# Patient Record
Sex: Female | Born: 1975
Health system: Southern US, Community
[De-identification: ages and names within clinical notes are randomized; demographics above are authoritative.]

---

## 2017-09-12 ENCOUNTER — Other Ambulatory Visit: Payer: Self-pay | Admitting: Internal Medicine

## 2017-09-12 DIAGNOSIS — Z1231 Encounter for screening mammogram for malignant neoplasm of breast: Secondary | ICD-10-CM

## 2017-09-26 ENCOUNTER — Other Ambulatory Visit (HOSPITAL_COMMUNITY)
Admission: RE | Admit: 2017-09-26 | Discharge: 2017-09-26 | Disposition: A | Discharge status: Home / Self Care | Payer: 59 | Source: Ambulatory Visit | Attending: Internal Medicine | Admitting: Internal Medicine

## 2017-09-26 ENCOUNTER — Other Ambulatory Visit: Payer: Self-pay | Admitting: Internal Medicine

## 2017-09-26 DIAGNOSIS — Z124 Encounter for screening for malignant neoplasm of cervix: Secondary | ICD-10-CM | POA: Insufficient documentation

## 2017-10-01 LAB — CYTOLOGY - PAP
Adequacy: ABSENT
CHLAMYDIA, DNA PROBE: NEGATIVE
Diagnosis: NEGATIVE
HPV (WINDOPATH): NOT DETECTED
Neisseria Gonorrhea: NEGATIVE

## 2017-10-14 ENCOUNTER — Ambulatory Visit
Admission: RE | Admit: 2017-10-14 | Discharge: 2017-10-14 | Disposition: A | Discharge status: Home / Self Care | Payer: PRIVATE HEALTH INSURANCE | Source: Ambulatory Visit | Attending: Internal Medicine | Admitting: Internal Medicine

## 2017-10-14 DIAGNOSIS — Z1231 Encounter for screening mammogram for malignant neoplasm of breast: Secondary | ICD-10-CM

## 2017-10-15 ENCOUNTER — Other Ambulatory Visit: Payer: Self-pay | Admitting: Internal Medicine

## 2017-10-15 DIAGNOSIS — R928 Other abnormal and inconclusive findings on diagnostic imaging of breast: Secondary | ICD-10-CM

## 2017-10-16 ENCOUNTER — Other Ambulatory Visit: Payer: Self-pay | Admitting: Internal Medicine

## 2017-10-16 DIAGNOSIS — R928 Other abnormal and inconclusive findings on diagnostic imaging of breast: Secondary | ICD-10-CM

## 2017-10-22 ENCOUNTER — Ambulatory Visit
Admission: RE | Admit: 2017-10-22 | Discharge: 2017-10-22 | Disposition: A | Discharge status: Home / Self Care | Payer: PRIVATE HEALTH INSURANCE | Source: Ambulatory Visit | Attending: Internal Medicine | Admitting: Internal Medicine

## 2017-10-22 ENCOUNTER — Ambulatory Visit: Payer: PRIVATE HEALTH INSURANCE

## 2017-10-22 DIAGNOSIS — R928 Other abnormal and inconclusive findings on diagnostic imaging of breast: Secondary | ICD-10-CM

## 2018-07-14 ENCOUNTER — Other Ambulatory Visit: Payer: Self-pay | Admitting: Internal Medicine

## 2018-07-14 ENCOUNTER — Ambulatory Visit
Admission: RE | Admit: 2018-07-14 | Discharge: 2018-07-14 | Disposition: A | Discharge status: Home / Self Care | Payer: PRIVATE HEALTH INSURANCE | Source: Ambulatory Visit | Attending: Internal Medicine | Admitting: Internal Medicine

## 2018-07-14 DIAGNOSIS — R1084 Generalized abdominal pain: Secondary | ICD-10-CM

## 2018-09-02 ENCOUNTER — Other Ambulatory Visit: Payer: Self-pay | Admitting: Obstetrics and Gynecology

## 2019-09-22 IMAGING — CR DG ABDOMEN ACUTE W/ 1V CHEST
3 series · 3 of 3 positions shown · non-contrast
Comparison: None.

CLINICAL DATA: Abdominal pain for 1 week

EXAM:
DG ABDOMEN ACUTE W/ 1V CHEST

[t abdomen supine]
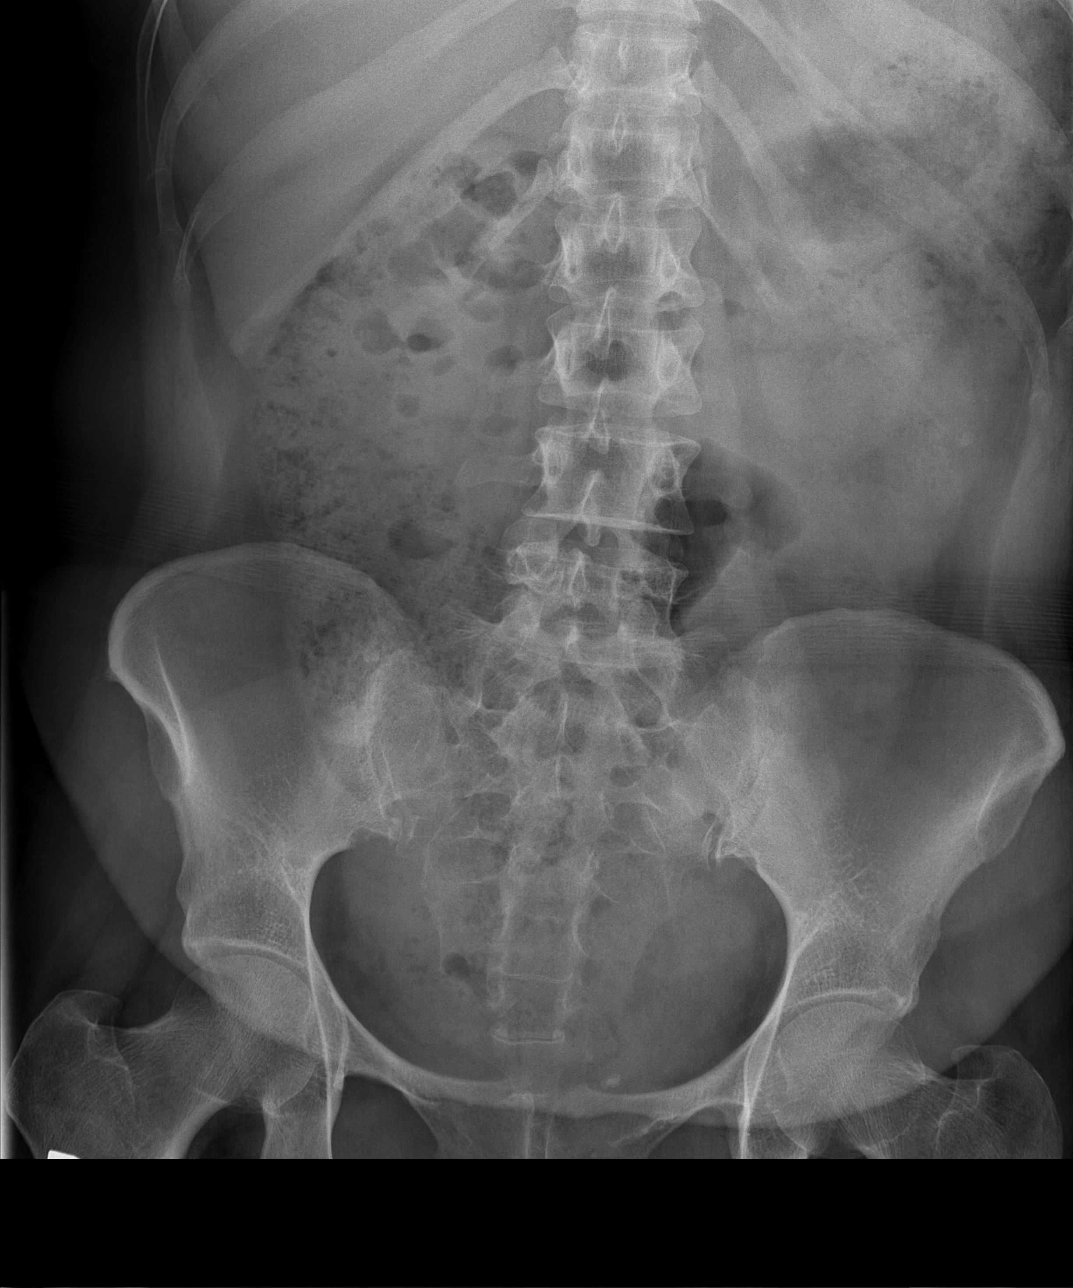

[w abdomen upright *]
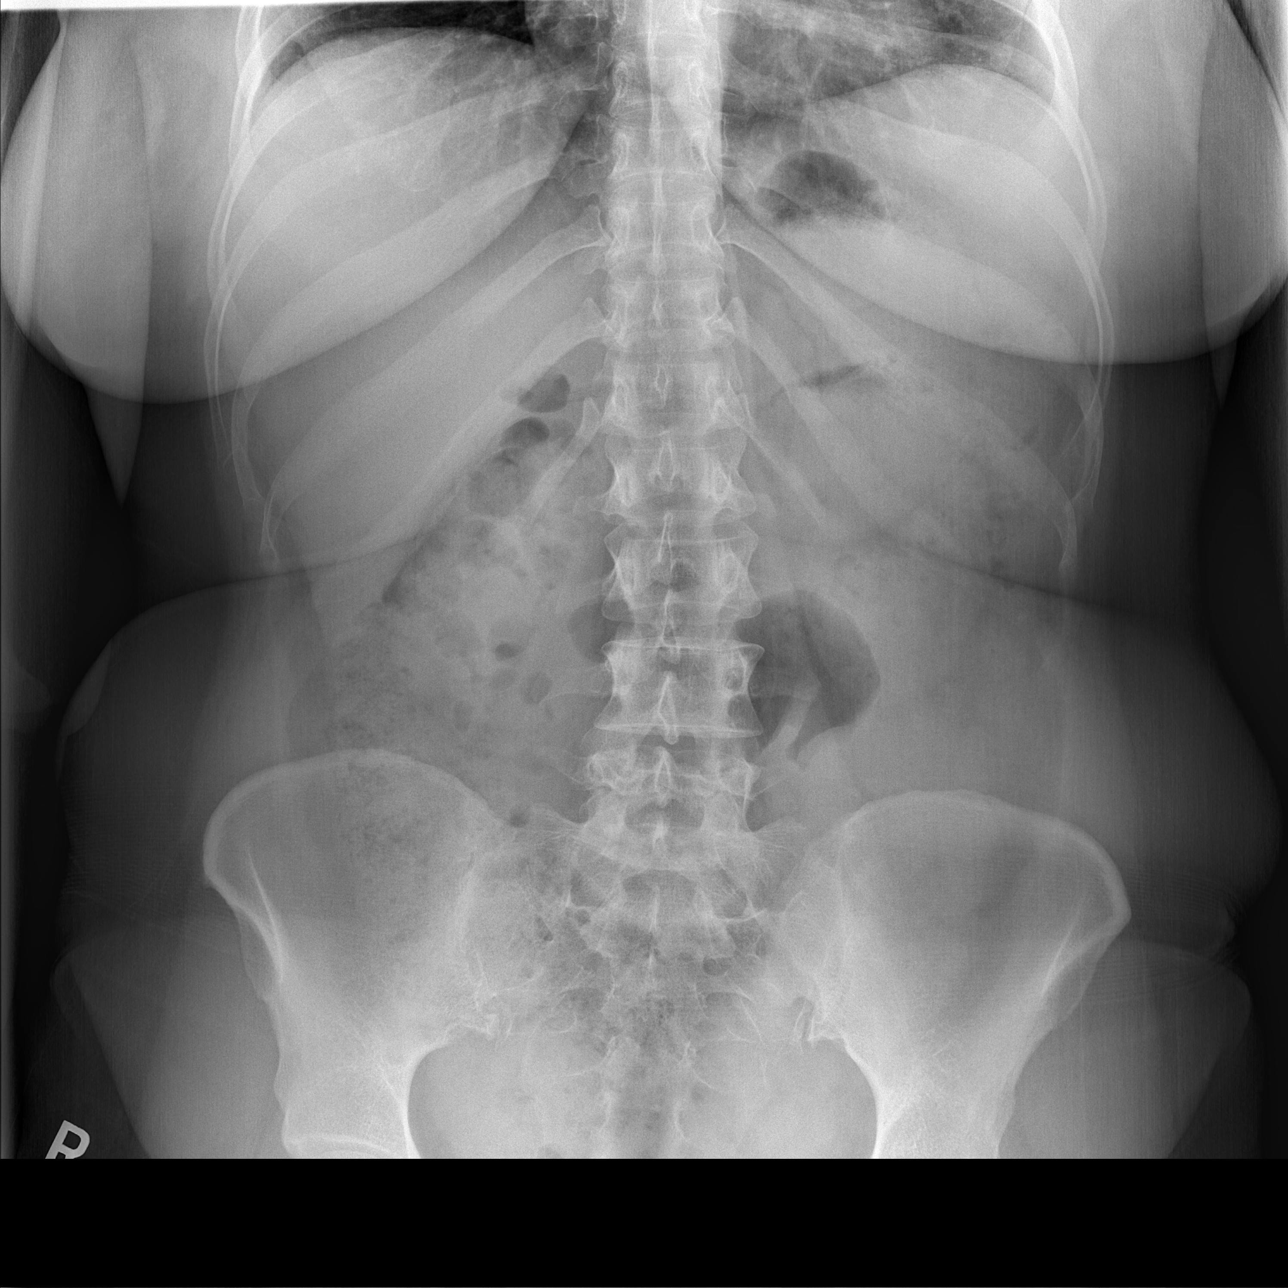

[w chest pa]
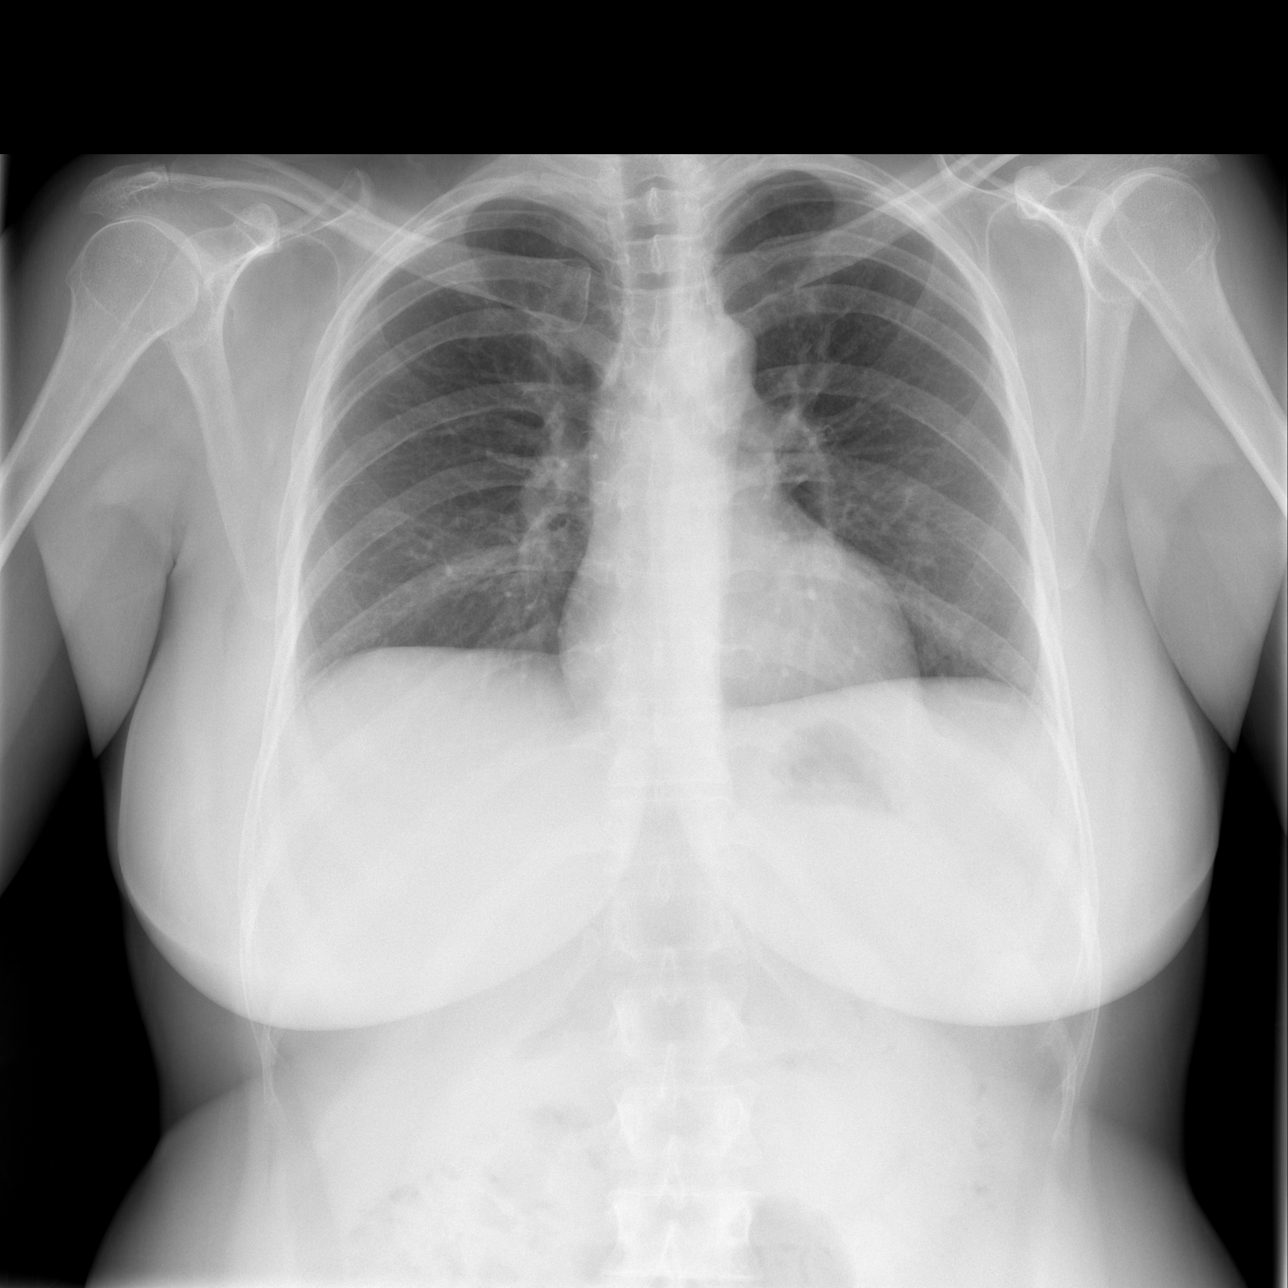

[3 of 3 positions shown; findings below may reference images not displayed]

FINDINGS: PA chest: Lungs are clear. Heart size and pulmonary vascularity are
normal. No adenopathy.

Supine and upright abdomen: There is moderate stool throughout the
colon. There is no bowel dilatation or air-fluid level to suggest
bowel obstruction. No free air. There are small phleboliths in the
lower left pelvis.
IMPRESSION: Moderate stool in colon. No bowel obstruction or free air. Lungs
clear.

## 2019-11-23 ENCOUNTER — Other Ambulatory Visit: Payer: Self-pay | Admitting: Internal Medicine

## 2019-11-23 DIAGNOSIS — Z1231 Encounter for screening mammogram for malignant neoplasm of breast: Secondary | ICD-10-CM

## 2020-01-11 ENCOUNTER — Ambulatory Visit (INDEPENDENT_AMBULATORY_CARE_PROVIDER_SITE_OTHER): Payer: PRIVATE HEALTH INSURANCE | Admitting: Internal Medicine

## 2020-01-11 ENCOUNTER — Other Ambulatory Visit: Payer: Self-pay

## 2020-01-11 ENCOUNTER — Encounter: Payer: Self-pay | Admitting: Internal Medicine

## 2020-01-11 VITALS — BP 118/82 | HR 91 | Temp 98.1°F | Ht 63.0 in | Wt 149.8 lb

## 2020-01-11 DIAGNOSIS — E1165 Type 2 diabetes mellitus with hyperglycemia: Secondary | ICD-10-CM | POA: Diagnosis not present

## 2020-01-11 DIAGNOSIS — E785 Hyperlipidemia, unspecified: Secondary | ICD-10-CM | POA: Diagnosis not present

## 2020-01-11 MED ORDER — JARDIANCE 25 MG PO TABS: 25.0000 mg | ORAL_TABLET | Freq: Every day | ORAL | 6 refills | Status: AC

## 2020-01-11 NOTE — Patient Instructions (Signed)
-   Increase Jardiance to 25 mg with Breakfast  - Continue Metformin 500 mg XR, 2 tablets with Breakfast and 2 tablets with Supper      - Check sugar fasting and bedtime when you can     - HOW TO TREAT LOW BLOOD SUGARS (Blood sugar LESS THAN 70 MG/DL)  Please follow the RULE OF 15 for the treatment of hypoglycemia treatment (when your (blood sugars are less than 70 mg/dL)    STEP 1: Take 15 grams of carbohydrates when your blood sugar is low, which includes:   3-4 GLUCOSE TABS  OR  3-4 OZ OF JUICE OR REGULAR SODA OR  ONE TUBE OF GLUCOSE GEL     STEP 2: RECHECK blood sugar in 15 MINUTES STEP 3: If your blood sugar is still low at the 15 minute recheck --> then, go back to STEP 1 and treat AGAIN with another 15 grams of carbohydrates.

## 2020-01-11 NOTE — Progress Notes (Signed)
Name: Melissa Carter  MRN/ DOB: [id number], July 08, 1976   Age/ Sex: 44 y.o., female    PCP: Lorenda Ishihara, MD   Reason for Endocrinology Evaluation: Type 2 Diabetes Mellitus     Date of Initial Endocrinology Visit: 01/12/2020     PATIENT IDENTIFIER: [name] is a 44 y.o. female with a past medical history of T2DM, Hypothyroidism and dyslipidemia . The patient presented for initial endocrinology clinic visit on 01/12/2020 for consultative assistance with her diabetes management.    HPI: Melissa Carter was    Diagnosed with DM  In 2013 Prior Medications tried/Intolerance: Trulicity - tongue swelling. Januvia - insurance issues  Currently checking blood sugars occasionally   Hypoglycemia episodes : no        Hemoglobin A1c has ranged from 6.5%  peaking at  7.9 % in 2020 Patient required assistance for hypoglycemia: no  Patient has required hospitalization within the last 1 year from hyper or hypoglycemia: no   In terms of diet, the patient 2-3 meals a day .    HOME DIABETES REGIMEN: Jardiance 10 mg daily  Metformin 500 mg XR , 2 tabs BID   Statin: yes ACE-I/ARB: no Prior Diabetic Education: no    METER DOWNLOAD SUMMARY: Did not bring    DIABETIC COMPLICATIONS: Microvascular complications:    Denies: retinopathy, CKD, neuropathy   Last eye exam: Completed 08/2019    Macrovascular complications:    Denies: CAD, PVD, CVA   Father, mother  , paternal  with DM , siblings   PAST HISTORY:  Past Medical History: No past medical history on file.  Past Surgical History: No past surgical history on file.   Social History:  reports that she has never smoked. She has never used smokeless tobacco. She reports that she does not drink alcohol. No history on file for drug.  Family History: No family history on file.   HOME MEDICATIONS: Allergies as of 01/11/2020   No Known Allergies     Medication List       Accurate as of January 11, 2020 11:59 PM. If  you have any questions, ask your nurse or doctor.        Accu-Chek Aviva device by Other route. Use as instructed   ferrous sulfate 325 (65 FE) MG tablet Take 325 mg by mouth daily with breakfast.   Jardiance 25 MG Tabs tablet Generic drug: empagliflozin Take 25 mg by mouth daily before breakfast. What changed:   medication strength  how much to take  when to take this Changed by: Scarlette Shorts, MD   levothyroxine 100 MCG tablet Commonly known as: SYNTHROID Take 100 mcg by mouth daily before breakfast.   metFORMIN 500 MG 24 hr tablet Commonly known as: GLUCOPHAGE-XR Take 500 mg by mouth daily with breakfast. 4 tabs with ,meal   simvastatin 10 MG tablet Commonly known as: ZOCOR Take 10 mg by mouth daily.   vitamin B-12 1000 MCG tablet Commonly known as: CYANOCOBALAMIN Take 1,000 mcg by mouth daily.        ALLERGIES: No Known Allergies   REVIEW OF SYSTEMS: A comprehensive ROS was conducted with the patient and is negative except as per HPI and below:  Review of Systems  Gastrointestinal: Negative for diarrhea and nausea.  Genitourinary: Negative for frequency.  Neurological: Positive for tingling. Negative for tremors.  Endo/Heme/Allergies: Negative for polydipsia.      OBJECTIVE:   VITAL SIGNS: BP 118/82 (BP Location: Right Arm, Patient Position: Sitting, Cuff Size:  Large)   Pulse 91   Temp 98.1 F (36.7 C)   Ht 5\' 3"  (1.6 m)   Wt 149 lb 12.8 oz (67.9 kg)   SpO2 98%   BMI 26.54 kg/m    PHYSICAL EXAM:  General: Pt appears well and is in NAD  HEENT:   Eyes: External eye exam normal without stare, lid lag or exophthalmos.  EOM intact.    Neck: General: Supple without adenopathy or carotid bruits. Thyroid: Thyroid size normal.  No goiter or nodules appreciated. No thyroid bruit.  Lungs: Clear with good BS bilat with no rales, rhonchi, or wheezes  Heart: RRR with normal S1 and S2 and no gallops; no murmurs; no rub  Abdomen: Normoactive  bowel sounds, soft, nontender, without masses or organomegaly palpable  Extremities:  Lower extremities - No pretibial edema. No lesions.  Skin: Normal texture and temperature to palpation.   Neuro: MS is good with appropriate affect, pt is alert and Ox3    DM foot exam: 01/11/2020  The skin of the feet is intact without sores or ulcerations. The pedal pulses are 2+ on right and 2+ on left. The sensation is intact to a screening 5.07, 10 gram monofilament bilaterally   DATA REVIEWED:  11/23/2019 A1c 7.7%   ASSESSMENT / PLAN / RECOMMENDATIONS:   1) Type 2 Diabetes Mellitus, Sub-Optimally controlled, Without complications - Most recent A1c of 7.7 %. Goal A1c < 7.0%.    Plan: GENERAL: Patient does NOT meet criteria for further evaluation of MODY , she was diagnosed with DM in to her 30's. She has parents with DM but unknown diabetes history in grand parents, either way, the mainstay of treatment of MODY is similar to T2DM which is diet, exercise and SU. I have discussed with the patient the pathophysiology of diabetes. We went over the natural progression of the disease. We talked about both insulin resistance and insulin deficiency. We stressed the importance of lifestyle changes including diet and exercise. I explained the complications associated with diabetes including retinopathy, nephropathy, neuropathy as well as increased risk of cardiovascular disease. We went over the benefit seen with glycemic control.    I explained to the patient that diabetic patients are at higher than normal risk for amputations. The patient was informed that diabetes is the number one cause of non-traumatic amputations in 11/25/2019. The patient was advised to look and examine their feet , wear proper fitting shoes and not to go barefoot.   I have emphasized the importance of low carb diet  I have giving her the option of adding SU vs increasing the dose of jardiance at this time, pt opted to increase the  dose of Jardiance   We discussed the importance of proper hydration with SGLT-2 inhibitors, we also discussed risk of genital infections   MEDICATIONS: - Increase Jardiance to 25 mg with Breakfast  - Continue Metformin 500 mg XR, 2 tablets with Breakfast and 2 tablets with Supper   EDUCATION / INSTRUCTIONS:  BG monitoring instructions: Patient is instructed to check her blood sugars 2 times a day, fasting and bedtime.  Call  Endocrinology clinic if: BG persistently < 70 or > 300. . I reviewed the Rule of 15 for the treatment of hypoglycemia in detail with the patient. Literature supplied.   2) Diabetic complications:   Eye: Does not have known diabetic retinopathy.   Neuro/ Feet: Does not have known diabetic peripheral neuropathy.  Renal: Patient does not have known baseline CKD. She is  not on an ACEI/ARB at present.  3) Lipids: Patient is on simvastatin,. We discussed cardiovascular benefits, LDL improving.   F/u in 3 months      Signed electronically by: Mack Guise, MD  [facility]  [facility] [address] [place], [place] [address] Phone: [phone] FAX: [phone]   CC: Leeroy Cha, [address] [place] [address] Phone: [phone]  Fax: [phone]    Return to Endocrinology clinic as below: Future Appointments  Date Time Provider [place]  02/15/2020  8:00 AM [facility] MM 2 [facility]  04/18/2020  3:40 PM Shamleffer, Melanie Crazier, MD [facility] None

## 2020-01-12 ENCOUNTER — Ambulatory Visit: Payer: PRIVATE HEALTH INSURANCE

## 2020-01-12 DIAGNOSIS — E1165 Type 2 diabetes mellitus with hyperglycemia: Secondary | ICD-10-CM | POA: Insufficient documentation

## 2020-01-12 DIAGNOSIS — E785 Hyperlipidemia, unspecified: Secondary | ICD-10-CM | POA: Insufficient documentation

## 2020-01-12 MED ORDER — METFORMIN HCL ER 500 MG PO TB24: 1000.0000 mg | ORAL_TABLET | Freq: Two times a day (BID) | ORAL | 3 refills | Status: AC

## 2020-02-15 ENCOUNTER — Ambulatory Visit: Payer: PRIVATE HEALTH INSURANCE

## 2020-02-29 ENCOUNTER — Ambulatory Visit: Payer: PRIVATE HEALTH INSURANCE

## 2020-03-07 ENCOUNTER — Other Ambulatory Visit: Payer: Self-pay

## 2020-03-07 ENCOUNTER — Ambulatory Visit
Admission: RE | Admit: 2020-03-07 | Discharge: 2020-03-07 | Disposition: A | Discharge status: Home / Self Care | Payer: PRIVATE HEALTH INSURANCE | Source: Ambulatory Visit | Attending: Internal Medicine | Admitting: Internal Medicine

## 2020-03-07 DIAGNOSIS — Z1231 Encounter for screening mammogram for malignant neoplasm of breast: Secondary | ICD-10-CM

## 2020-04-18 ENCOUNTER — Ambulatory Visit: Payer: PRIVATE HEALTH INSURANCE | Admitting: Internal Medicine

## 2021-04-06 DIAGNOSIS — Z0000 Encounter for general adult medical examination without abnormal findings: Secondary | ICD-10-CM | POA: Diagnosis not present

## 2021-04-06 DIAGNOSIS — E039 Hypothyroidism, unspecified: Secondary | ICD-10-CM | POA: Diagnosis not present

## 2021-04-06 DIAGNOSIS — E1169 Type 2 diabetes mellitus with other specified complication: Secondary | ICD-10-CM | POA: Diagnosis not present

## 2021-05-16 IMAGING — MG DIGITAL SCREENING BILAT W/ TOMO W/ CAD
8 series · 8 of 24 positions shown · non-contrast
Comparison: Previous exam(s).

CLINICAL DATA: Screening.

EXAM:
DIGITAL SCREENING BILATERAL MAMMOGRAM WITH TOMO AND CAD

[R MLO synth-2D]
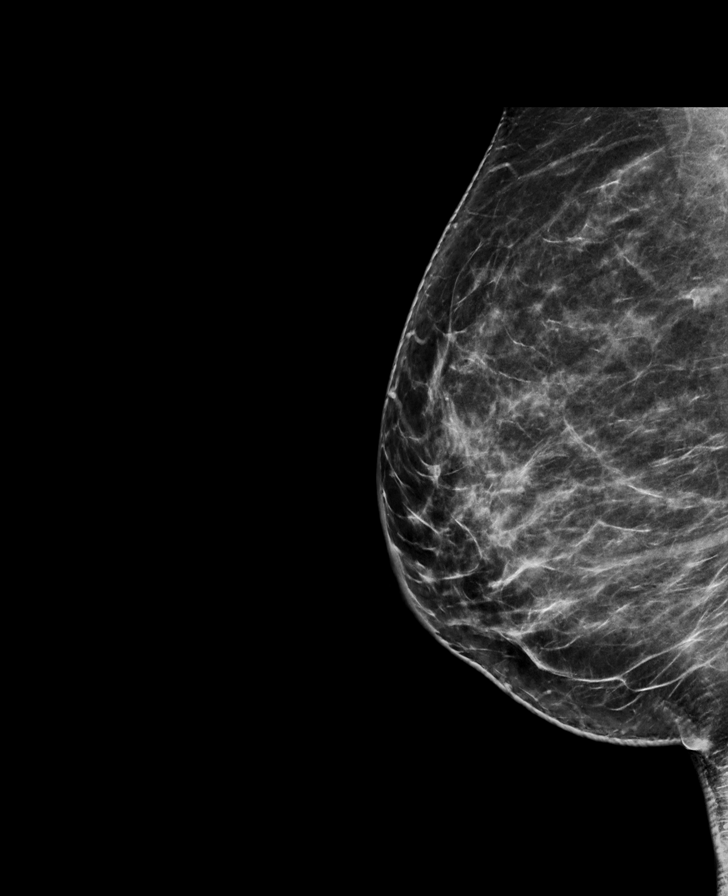

[L CC synth-2D]
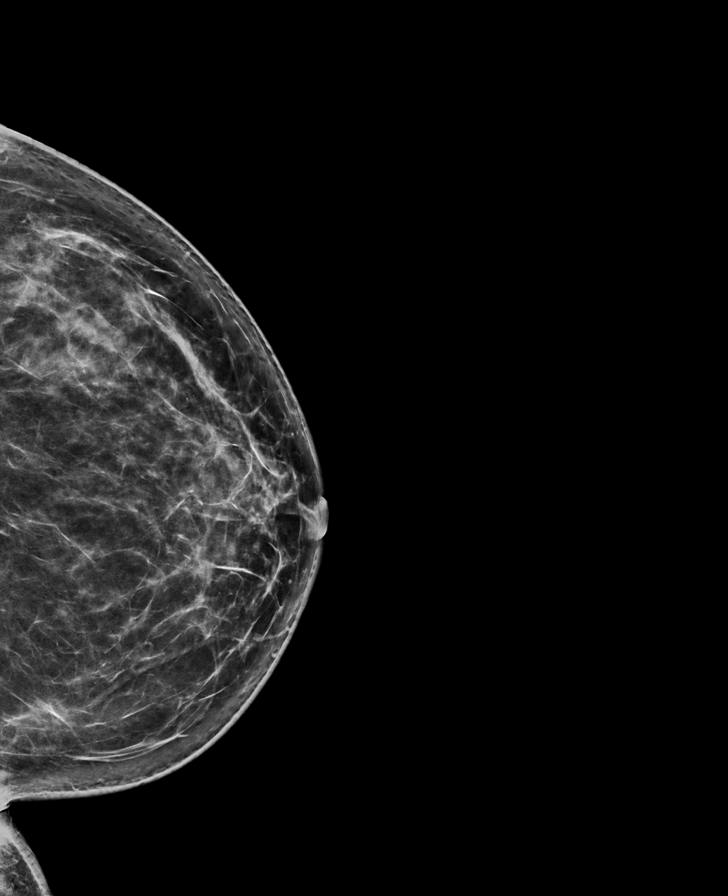

[L MLO synth-2D]
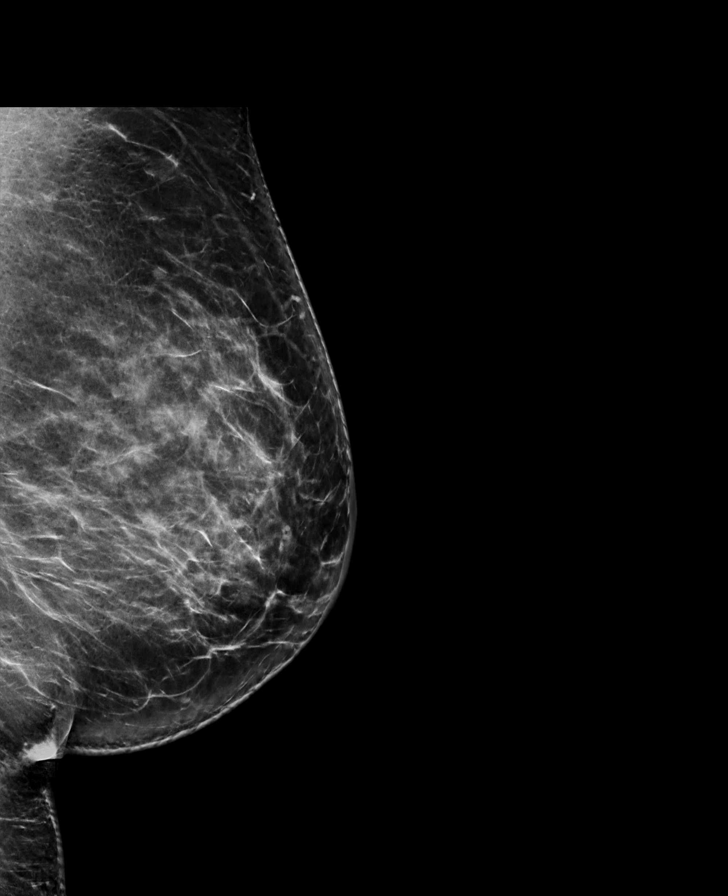

[R CC synth-2D]
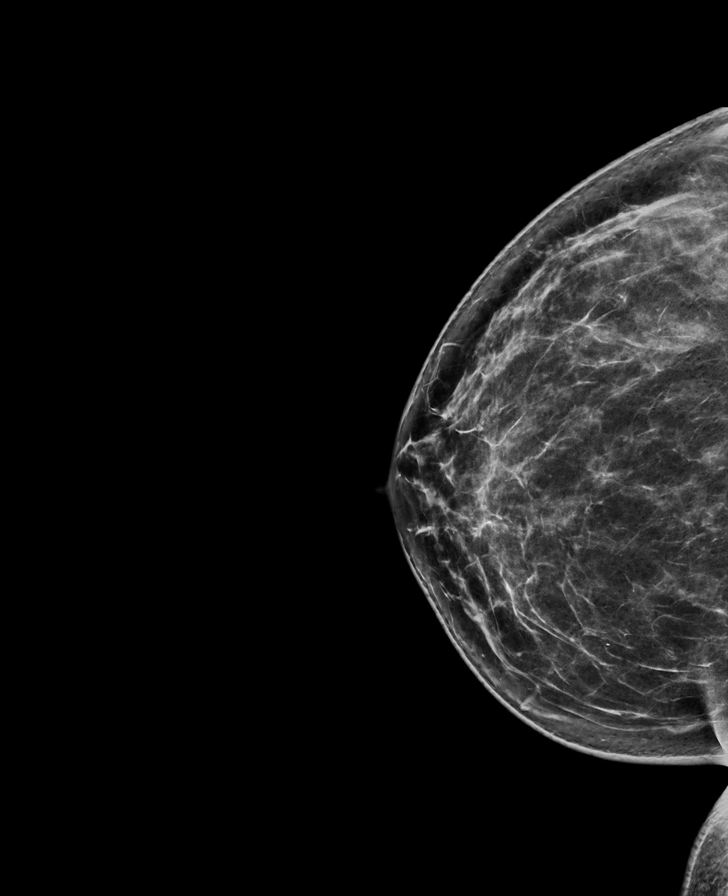

[L MLO tomo · tomo slice 46/91.0]
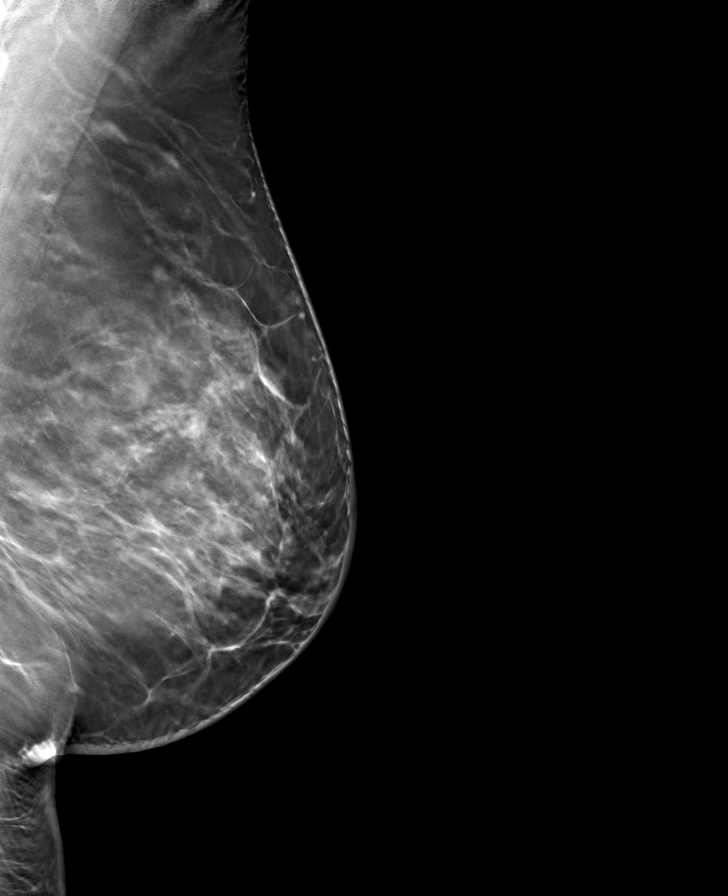

[R CC tomo · tomo slice 41/80.0]
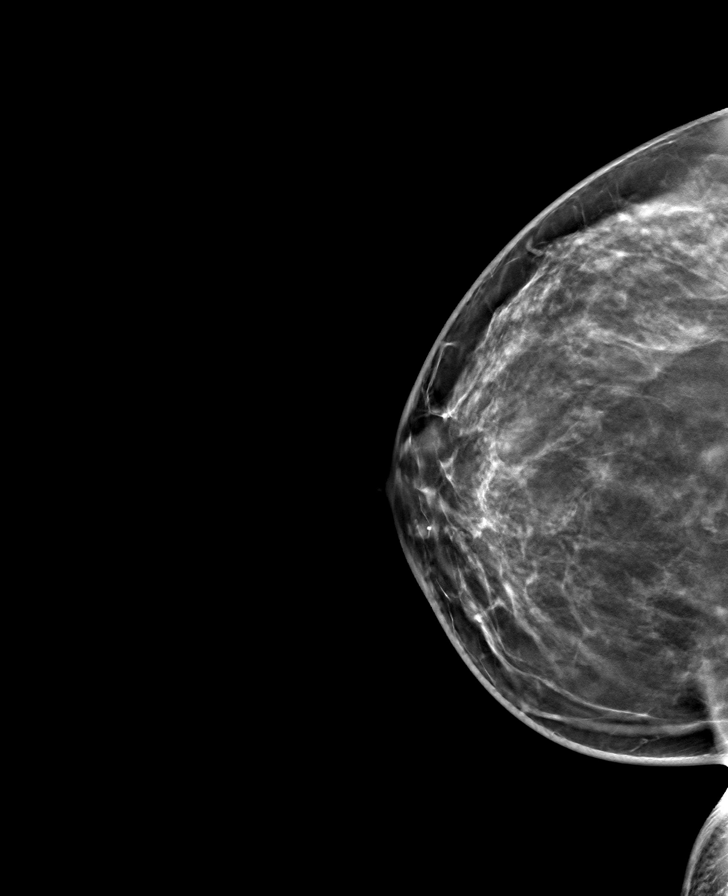

[R MLO tomo · tomo slice 41/81.0]
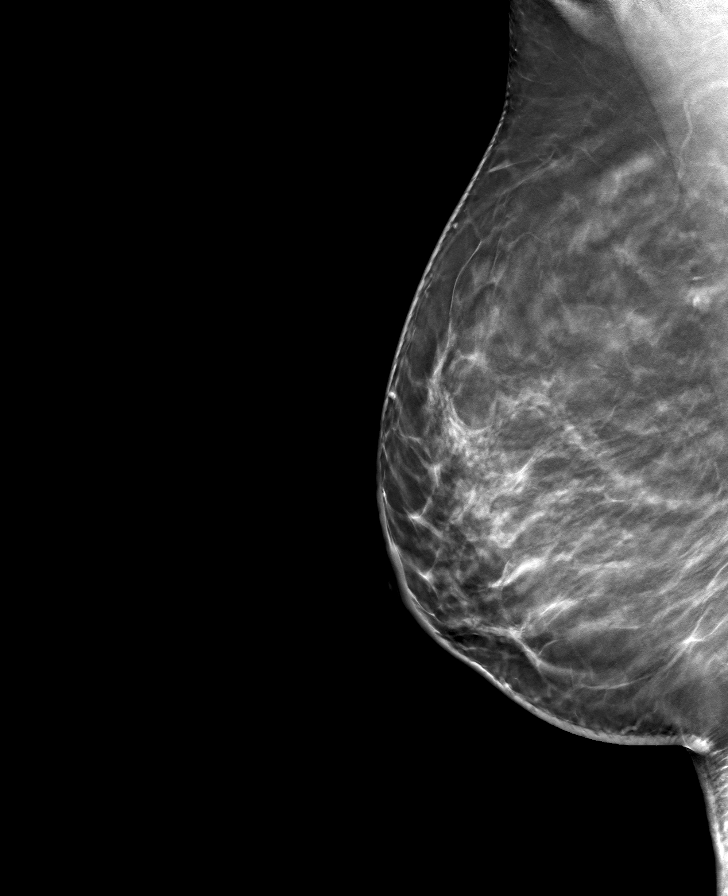

[L CC tomo · tomo slice 41/80.0]
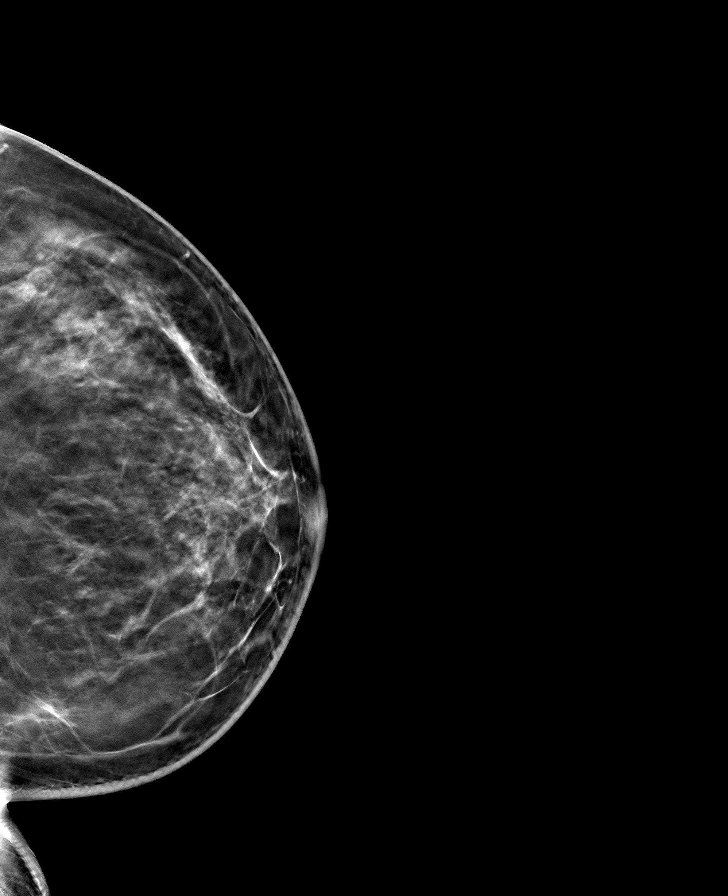

[8 of 24 positions shown; findings below may reference images not displayed]

ACR Breast Density Category c: The breast tissue is heterogeneously
dense, which may obscure small masses.
FINDINGS: There are no findings suspicious for malignancy. Images were
processed with CAD.
IMPRESSION: No mammographic evidence of malignancy. A result letter of this
screening mammogram will be mailed directly to the patient.

RECOMMENDATION:
Screening mammogram in one year. (Code:[id number])

BI-RADS CATEGORY  1: Negative.

## 2021-06-21 DIAGNOSIS — Z13228 Encounter for screening for other metabolic disorders: Secondary | ICD-10-CM | POA: Diagnosis not present

## 2021-06-21 DIAGNOSIS — Z130 Encounter for screening for diseases of the blood and blood-forming organs and certain disorders involving the immune mechanism: Secondary | ICD-10-CM | POA: Diagnosis not present

## 2021-06-21 DIAGNOSIS — E039 Hypothyroidism, unspecified: Secondary | ICD-10-CM | POA: Diagnosis not present
